# Patient Record
Sex: Male | Born: 1960 | Race: Black or African American | Hispanic: No | Marital: Single | Smoking: Never smoker
Health system: Southern US, Community
[De-identification: ages and names within clinical notes are randomized; demographics above are authoritative.]

---

## 1999-06-19 ENCOUNTER — Ambulatory Visit (HOSPITAL_COMMUNITY): Admission: RE | Admit: 1999-06-19 | Discharge: 1999-06-19 | Payer: Self-pay | Admitting: *Deleted

## 2009-11-04 ENCOUNTER — Ambulatory Visit: Admission: RE | Admit: 2009-11-04 | Discharge: 2009-11-04 | Payer: Self-pay | Admitting: Orthopaedic Surgery

## 2009-11-04 ENCOUNTER — Encounter (INDEPENDENT_AMBULATORY_CARE_PROVIDER_SITE_OTHER): Payer: Self-pay | Admitting: Orthopaedic Surgery

## 2009-11-04 ENCOUNTER — Ambulatory Visit: Payer: Self-pay | Admitting: Vascular Surgery

## 2010-12-22 NOTE — Procedures (Signed)
[place]  Patient:    Jeffrey Walton                         MRN: [id number] Proc. Date: 06/19/99 Adm. Date:  [id number] Attending:  Mingo Amber CC:         Redmond Baseman, M.D.                           Procedure Report  PROCEDURES: 1. Video upper endoscopy. 2. Video colonoscopy.  INDICATIONS:  A 50 year old male with microcytic anemia and iron deficiency. He has been on iron therapy and as of today, his hemoglobin is 12.5.  It should be  noted that he has donated over 2.5 gallons of blood in life time and this likely accounts for some of his iron deficiency.  PREPARATION:  He is n.p.o. since midnight, having taken Phospho-Soda prep and a  clear liquid diet yesterday.  The mucosa throughout is clean.  PREPROCEDURE SEDATION:  Prior to the upper endoscopy, he received 50 mg of Demerol and 6 mg Versed intravenously.  In addition, his throat was anesthetized with Hurricaine spray and he was on 2 L of nasal cannula O2.  PROCEDURE:  The Olympus video upper endoscope was inserted via the mouth and advanced through the upper esophageal sphincter with ease.  Intubation was then  carried out well into the descending duodenum.  On withdrawal, the mucosa was carefully evaluated.  Descending duodenum and bulb appeared normal.  There was moderate prepyloric gastritis with small erosions seen.  CLOtest was taken from  this area and it was immediately positive.  The remainder of the stomach, retroflexed view of the GE junction and the distal esophagus all appeared normal. At the conclusion of this procedure, the patients position was reversed for procedure #2: video colonoscopy.  He received an additional 40 mg of Demerol and 3 mg of Versed intravenously.   The Olympus video colonoscope was inserted via he rectum and advanced quite easily through the entire colon to the cecum.  Cecal landmarks were identified and photographed.  On  withdrawal, the mucosa was carefully evaluated and found to be entirely normal from cecum to retroflexed view of the rectum.  Patient tolerated the procedure well.  Pulse, blood pressure and oximetry testing were stable throughout.  He was observed in recovery for 45 minutes and discharged home alert with a benign abdomen.  IMPRESSION: 1. Helicobacter pylori gastritis, which could have accounted for ulcers or erosions, which are now largely healed.  This may have contributed to his iron deficiency. 2. Normal colonoscopy.  PLAN:  He is placed on Helidac therapy for 14 days.  He may return to see me if  needed.  Otherwise, he is referred back to the care of Dr. Modesto Charon.  He probably should remain on some iron supplement for 1 or 2 more months and defer donating  blood at least until 2001. DD:  06/19/99 TD:  06/20/99 Job: [id number]

## 2016-07-25 ENCOUNTER — Other Ambulatory Visit: Payer: Self-pay | Admitting: Occupational Medicine

## 2016-07-25 ENCOUNTER — Ambulatory Visit: Payer: Self-pay

## 2016-07-25 DIAGNOSIS — G8929 Other chronic pain: Secondary | ICD-10-CM

## 2016-07-25 DIAGNOSIS — M545 Low back pain: Principal | ICD-10-CM

## 2016-07-27 ENCOUNTER — Ambulatory Visit (INDEPENDENT_AMBULATORY_CARE_PROVIDER_SITE_OTHER): Payer: Worker's Compensation | Admitting: Orthopaedic Surgery

## 2016-07-27 VITALS — BP 151/93 | HR 84 | Temp 97.6°F

## 2016-07-27 DIAGNOSIS — M545 Low back pain, unspecified: Secondary | ICD-10-CM

## 2016-07-27 NOTE — Progress Notes (Signed)
Office Visit Note   Patient: Jeffrey Walton           Date of Birth: 12/23/1960           MRN: [id number] Visit Date: 07/27/2016              Requested by: No referring provider defined for this encounter. PCP: No primary care provider on file.   Assessment & Plan: Visit Diagnoses:  1. Acute bilateral low back pain without sciatica     Plan: We'll follow conservative treatment patient neurologically intact. He likely had a mild disc bulge with flare symptoms. He has prednisone pack which she will finish off. I reminded and make sure he takes it with food. We discussed GI symptoms second occurred her with the prednisone. His Ultram for pain. Work slip given no lifting more than 20 pounds 3 weeks and I'll recheck him again in 3 weeks.  Follow-Up Instructions: No Follow-up on file.   Orders:  No orders of the defined types were placed in this encounter.  No orders of the defined types were placed in this encounter.     Procedures: No procedures performed   Clinical Data: No additional findings.   Subjective: Chief Complaint  Patient presents with  . Lower Back - Injury    Patient is here today with lower back pain that is shooting down the left leg. 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   Injury  Pertinent negatives include no abdominal pain, chest pain, coughing, seizures or visual disturbance.  Patient has had some past problems with the his back which are resolved with conservative treatment. Many years ago had an MRI and he thinks he may have had of mild bulge in the disc at the time. Patient's states he thinks this was in 1996. Patient denies any associated bowel or bladder symptoms no fever or chills. When he first gets up he has had some difficulty get moving he has more symptoms that radiate in the left anterior thigh than the right. Patient's not fallen since injury. He was seen and placed on a prednisone pack also given some Flexeril and also  some Ultram for pain.  Review of Systems  Constitutional: Negative for chills and diaphoresis.  HENT: Negative for ear discharge, ear pain and nosebleeds.   Eyes: Negative for discharge and visual disturbance.       Patient uses glasses for reading  Respiratory: Negative for cough, choking and shortness of breath.   Cardiovascular: Negative for chest pain and palpitations.  Gastrointestinal: Negative for abdominal distention and abdominal pain.  Endocrine: Negative for cold intolerance and heat intolerance.  Genitourinary: Negative for flank pain and hematuria.  Musculoskeletal: Positive for back pain.  Skin: Negative for rash and wound.  Neurological: Negative for seizures and speech difficulty.  Hematological: Negative for adenopathy. Does not bruise/bleed easily.  Psychiatric/Behavioral: Negative for agitation and suicidal ideas.     Objective: Vital Signs: BP (!) 151/93 (BP Location: Right Arm, Patient Position: Sitting, Cuff Size: Large)   Pulse 84   Temp 97.6 F (36.4 C) (Oral)   Physical Exam  Constitutional: He is oriented to person, place, and time. He appears well-developed and well-nourished.  HENT:  Head: Normocephalic and atraumatic.  Eyes: EOM are normal. Pupils are equal, round, and reactive to light.  Neck: No tracheal deviation present. No thyromegaly present.  Cardiovascular: Normal rate.   Pulmonary/Chest: Effort normal. He has no wheezes.  Abdominal: Soft. Bowel sounds are  normal.  Musculoskeletal:  Pelvis is level patient's slow getting for sitting standing. No pain with internal/external rotation of his hips quad strength is good negative straight leg raising 90 negative popped a compression test anterior tib EHL is strong no gastroc soleus atrophy. Heel and toe walking is normal. Normal heel-to-toe gait.  Neurological: He is alert and oriented to person, place, and time.  Skin: Skin is warm and dry. Capillary refill takes less than 2 seconds.    Psychiatric: He has a normal mood and affect. His behavior is normal. Judgment and thought content normal.    Ortho Exam  Specialty Comments:  No specialty comments available.  Imaging: No results found. X-rays from urgent were reviewed with patient this shows minimal endplate spurring and some mild facet changes consistent with some lumbar spondylosis and some minimal disc degeneration by plain radiograph. There is no pars defects no spondylolisthesis no evidence of acute fracture.  PMFS History: There are no active problems to display for this patient.  No past medical history on file.  No family history on file.  No past surgical history on file. Social History   Occupational History  . Not on file.   Social History Main Topics  . Smoking status: Not on file  . Smokeless tobacco: Not on file  . Alcohol use Not on file  . Drug use: Unknown  . Sexual activity: Not on file

## 2016-08-10 DIAGNOSIS — M48061 Spinal stenosis, lumbar region without neurogenic claudication: Secondary | ICD-10-CM | POA: Diagnosis not present

## 2016-08-10 DIAGNOSIS — M47896 Other spondylosis, lumbar region: Secondary | ICD-10-CM | POA: Diagnosis not present

## 2016-08-10 DIAGNOSIS — M5126 Other intervertebral disc displacement, lumbar region: Secondary | ICD-10-CM | POA: Diagnosis not present

## 2016-08-19 DIAGNOSIS — K5900 Constipation, unspecified: Secondary | ICD-10-CM | POA: Diagnosis not present

## 2016-08-24 ENCOUNTER — Ambulatory Visit (INDEPENDENT_AMBULATORY_CARE_PROVIDER_SITE_OTHER): Payer: Worker's Compensation | Admitting: Orthopaedic Surgery

## 2016-08-24 VITALS — BP 129/81 | HR 117 | Ht 68.0 in | Wt 240.0 lb

## 2016-08-24 DIAGNOSIS — M5126 Other intervertebral disc displacement, lumbar region: Secondary | ICD-10-CM | POA: Diagnosis not present

## 2016-08-24 NOTE — Progress Notes (Signed)
Office Visit Note   Patient: Jeffrey Walton           Date of Birth: January 24, 1961           MRN: [id number] Visit Date: 08/24/2016              Requested by: No referring provider defined for this encounter. PCP: Darrow Bussing, MD   Assessment & Plan: Visit Diagnoses:  1. Herniated intervertebral disc of lumbar spine      Left inferior migrated HNP L3-4  Plan: Patient will return in one month if he develops recurrent radicular symptoms he will notify us. He'll continue a walking program. We discussed signs and symptoms of radiculopathy which she understands.  Follow-Up Instructions: No Follow-up on file.   Orders:  No orders of the defined types were placed in this encounter.  No orders of the defined types were placed in this encounter.     Procedures: No procedures performed   Clinical Data: No additional findings.   Subjective: Chief Complaint  Patient presents with  . Lower Back - Pain, Follow-up    Patient returns for three week follow up low back pain. He is doing much better. He states that the pain is not as bad and he is getting some of his mobility back. He brought a MRI that he had made at the [place] for review today. He has not taken the tramadol or flexeril in a week.   MRI scan is reviewed from the [place]. This shows disc herniation L3-4 with compression. Patient has gotten significantly better in the last 3 weeks since his scan and states his leg strength is improved is walking better and resting better and stopped taking his medication.  Review of Systems  Constitutional: Negative for chills and diaphoresis.  HENT: Negative for ear discharge, ear pain and nosebleeds.   Eyes: Negative for discharge and visual disturbance.  Respiratory: Negative for cough, choking and shortness of breath.   Cardiovascular: Negative for chest pain and palpitations.  Gastrointestinal: Negative for abdominal distention and abdominal pain.  Endocrine: Negative for cold intolerance  and heat intolerance.  Genitourinary: Negative for flank pain and hematuria.  Musculoskeletal:       L3-4 H&P by lumbar MRI at the [facility]. Patient's leg pain is improved he still has slight numbness states the back pain is gotten  Skin: Negative for rash and wound.  Neurological: Negative for seizures and speech difficulty.  Hematological: Negative for adenopathy. Does not bruise/bleed easily.  Psychiatric/Behavioral: Negative for agitation and suicidal ideas.     Objective: Vital Signs: BP 129/81   Pulse (!) 117   Ht 5\' 8"  (1.727 m)   Wt 240 lb (108.9 kg)   BMI 36.49 kg/m   Physical Exam  Constitutional: He is oriented to person, place, and time. He appears well-developed and well-nourished.  HENT:  Head: Normocephalic and atraumatic.  Eyes: EOM are normal. Pupils are equal, round, and reactive to light.  Neck: No tracheal deviation present. No thyromegaly present.  Cardiovascular: Normal rate.   Pulmonary/Chest: Effort normal. He has no wheezes.  Abdominal: Soft. Bowel sounds are normal.  Musculoskeletal:  Slight lumbar tenderness - notch tenderness. Negative straight leg raising 90 anterior tib quads ankle dorsiflexion plantar flexion posterior tibial resistance testing are all normal. Distal pulses are intact no pitting edema. Hip range of motion is normal. Negative Fabere. No synovitis of the upper extremities wrists elbows are normal good grip strength.  Neurological: He is alert and oriented  to person, place, and time.  Skin: Skin is warm and dry. Capillary refill takes less than 2 seconds.  Psychiatric: He has a normal mood and affect. His behavior is normal. Judgment and thought content normal.    Ortho Exam  Specialty Comments:  No specialty comments available.  Imaging: No results found.   PMFS History: Patient Active Problem List   Diagnosis Date Noted  . Herniated intervertebral disc of lumbar spine 08/24/2016   No past medical history on file.  No  family history on file.  No past surgical history on file. Social History   Occupational History  . Not on file.   Social History Main Topics  . Smoking status: Not on file  . Smokeless tobacco: Not on file  . Alcohol use Not on file  . Drug use: Unknown  . Sexual activity: Not on file

## 2016-09-26 ENCOUNTER — Ambulatory Visit (INDEPENDENT_AMBULATORY_CARE_PROVIDER_SITE_OTHER): Payer: Worker's Compensation | Admitting: Orthopaedic Surgery

## 2016-09-26 ENCOUNTER — Encounter (INDEPENDENT_AMBULATORY_CARE_PROVIDER_SITE_OTHER): Payer: Self-pay | Admitting: Orthopaedic Surgery

## 2016-09-26 ENCOUNTER — Encounter (INDEPENDENT_AMBULATORY_CARE_PROVIDER_SITE_OTHER): Payer: Self-pay

## 2016-09-26 VITALS — BP 121/77 | HR 86 | Ht 68.0 in | Wt 245.0 lb

## 2016-09-26 DIAGNOSIS — M5126 Other intervertebral disc displacement, lumbar region: Secondary | ICD-10-CM | POA: Diagnosis not present

## 2016-09-26 NOTE — Progress Notes (Signed)
Office Visit Note   Patient: Jeffrey Walton           Date of Birth: 11/05/1960           MRN: [id number] Visit Date: 09/26/2016              Requested by: Darrow Bussingibas Koirala, MD [address] [place], [place] [address] PCP: Darrow BussingKOIRALA,DIBAS, MD   Assessment & Plan: Visit Diagnoses:  1. HNP (herniated nucleus pulposus), lumbar     Plan: Since patient states that he has resumed his regular activities and we will have him follow-up the office with us as needed. Advised him that if he has any worsening of his left lower extremity radiculopathy and any signs of weakness that he should return immediately for evaluation. He voiced understanding.  Also discussed proper lifting techniques. Avoid twisting.  Follow-Up Instructions: Return if symptoms worsen or fail to improve.   Orders:  No orders of the defined types were placed in this encounter.  No orders of the defined types were placed in this encounter.     Procedures: No procedures performed   Clinical Data: No additional findings.   Subjective: Chief Complaint  Patient presents with  . Lower Back - Follow-up    Patient returns for one month follow up low back pain. He states that he is doing better. He feels that he is getting back to normal.   Patient states that low back and left leg pain numbness and tingling is greatly improved. Continues to have some intermittent symptoms into his left thigh when he sits for a while. Nothing radiating below his knee. States that he has returned back to doing his regular activities. Denies any feeling of leg weakness. No complaints of bowel or bladder incontinence. He is also working his regular job without restrictions although he has been trying to avoid lifting objects that are too heavy.  Reviewed patient's chart and he has an upcoming appointment with neurosurgeon Dr Luisa [facility]Kyle [facility].      Review of Systems  Constitutional: Negative.   HENT: Negative.     Respiratory: Negative.   Cardiovascular: Negative.   Genitourinary: Negative.   Musculoskeletal: Positive for back pain.  Psychiatric/Behavioral: Negative.      Objective: Vital Signs: BP 121/77   Pulse 86   Ht 5\' 8"  (1.727 m)   Wt 245 lb (111.1 kg)   BMI 37.25 kg/m   Physical Exam  Constitutional: He is oriented to person, place, and time. He appears well-developed. No distress.  HENT:  Head: Normocephalic and atraumatic.  Eyes: EOM are normal. Pupils are equal, round, and reactive to light.  Pulmonary/Chest: No respiratory distress.  Musculoskeletal:  Gait is normal. No lumbar paraspinal tenderness. Nontender over the bilateral hip greater scan of versus. Negative logroll. Negative straight leg raise. Neurovascularly intact. No focal motor deficits.  Neurological: He is alert and oriented to person, place, and time.  Skin: Skin is warm and dry.  Psychiatric: He has a normal mood and affect.    Ortho Exam  Specialty Comments:  No specialty comments available.  Imaging: No results found.   PMFS History: Patient Active Problem List   Diagnosis Date Noted  . Herniated intervertebral disc of lumbar spine 08/24/2016   No past medical history on file.  No family history on file.  No past surgical history on file. Social History   Occupational History  . Not on file.   Social History Main Topics  .  Smoking status: Never Smoker  . Smokeless tobacco: Never Used  . Alcohol use No  . Drug use: No  . Sexual activity: Not on file

## 2016-09-28 DIAGNOSIS — Q7649 Other congenital malformations of spine, not associated with scoliosis: Secondary | ICD-10-CM | POA: Diagnosis not present

## 2016-09-28 DIAGNOSIS — M5116 Intervertebral disc disorders with radiculopathy, lumbar region: Secondary | ICD-10-CM | POA: Diagnosis not present

## 2016-10-30 DIAGNOSIS — Z0000 Encounter for general adult medical examination without abnormal findings: Secondary | ICD-10-CM | POA: Diagnosis not present

## 2017-01-01 DIAGNOSIS — K644 Residual hemorrhoidal skin tags: Secondary | ICD-10-CM | POA: Diagnosis not present

## 2017-01-01 DIAGNOSIS — Z1211 Encounter for screening for malignant neoplasm of colon: Secondary | ICD-10-CM | POA: Diagnosis not present

## 2017-01-07 DIAGNOSIS — Z86010 Personal history of colonic polyps: Secondary | ICD-10-CM | POA: Diagnosis not present

## 2017-01-07 DIAGNOSIS — L290 Pruritus ani: Secondary | ICD-10-CM | POA: Diagnosis not present

## 2017-01-14 DIAGNOSIS — K644 Residual hemorrhoidal skin tags: Secondary | ICD-10-CM | POA: Diagnosis not present

## 2017-01-14 DIAGNOSIS — Z86010 Personal history of colonic polyps: Secondary | ICD-10-CM | POA: Diagnosis not present

## 2017-01-14 DIAGNOSIS — K648 Other hemorrhoids: Secondary | ICD-10-CM | POA: Diagnosis not present

## 2017-05-03 DIAGNOSIS — Z23 Encounter for immunization: Secondary | ICD-10-CM | POA: Diagnosis not present

## 2017-10-31 DIAGNOSIS — Z0000 Encounter for general adult medical examination without abnormal findings: Secondary | ICD-10-CM | POA: Diagnosis not present

## 2017-10-31 DIAGNOSIS — Z13220 Encounter for screening for lipoid disorders: Secondary | ICD-10-CM | POA: Diagnosis not present

## 2017-12-09 DIAGNOSIS — E538 Deficiency of other specified B group vitamins: Secondary | ICD-10-CM | POA: Diagnosis not present

## 2017-12-09 DIAGNOSIS — M545 Low back pain: Secondary | ICD-10-CM | POA: Diagnosis not present

## 2017-12-09 DIAGNOSIS — Z0000 Encounter for general adult medical examination without abnormal findings: Secondary | ICD-10-CM | POA: Diagnosis not present

## 2018-03-20 IMAGING — CR DG LUMBAR SPINE COMPLETE 4+V
5 series · 5 of 5 positions shown · non-contrast
Comparison: No priors.

CLINICAL DATA: 55-year-old male with injury to the low back on
07/17/2016 after lifting a 50 pound back. Persistent low back pain
since that time.

EXAM:
LUMBAR SPINE - COMPLETE 4+ VIEW

[view not recorded (1 of 5)]
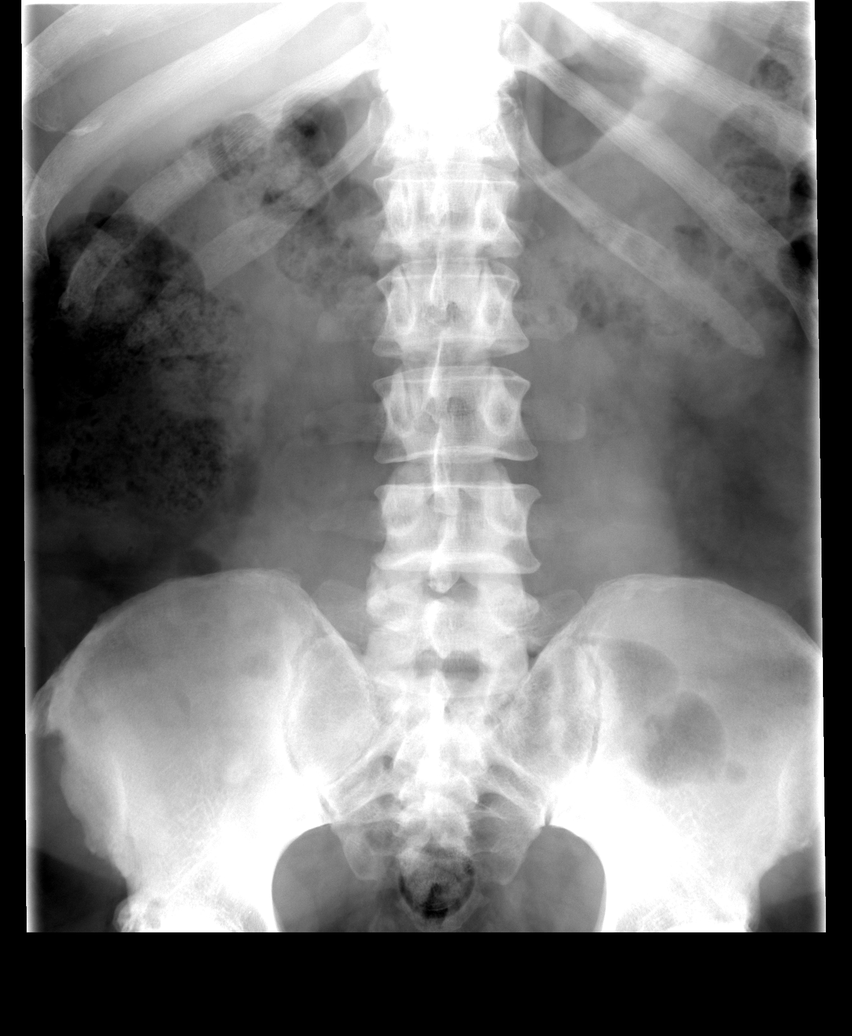

[view not recorded (2 of 5)]
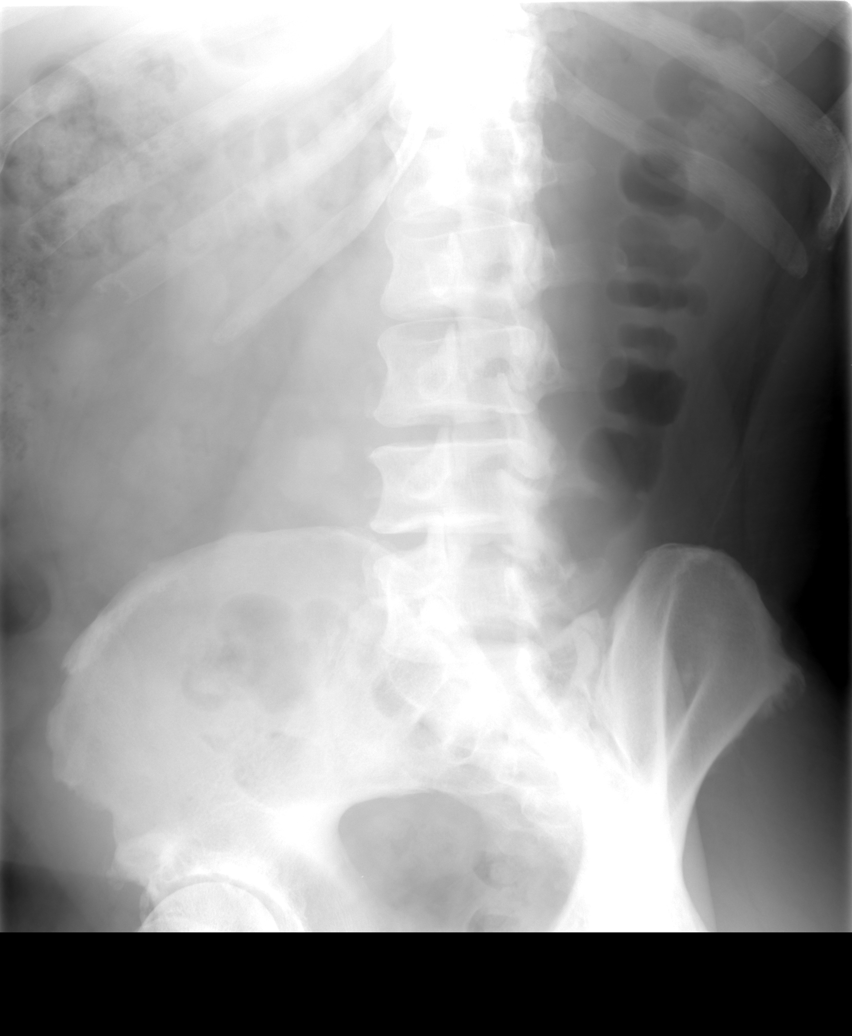

[view not recorded (3 of 5)]
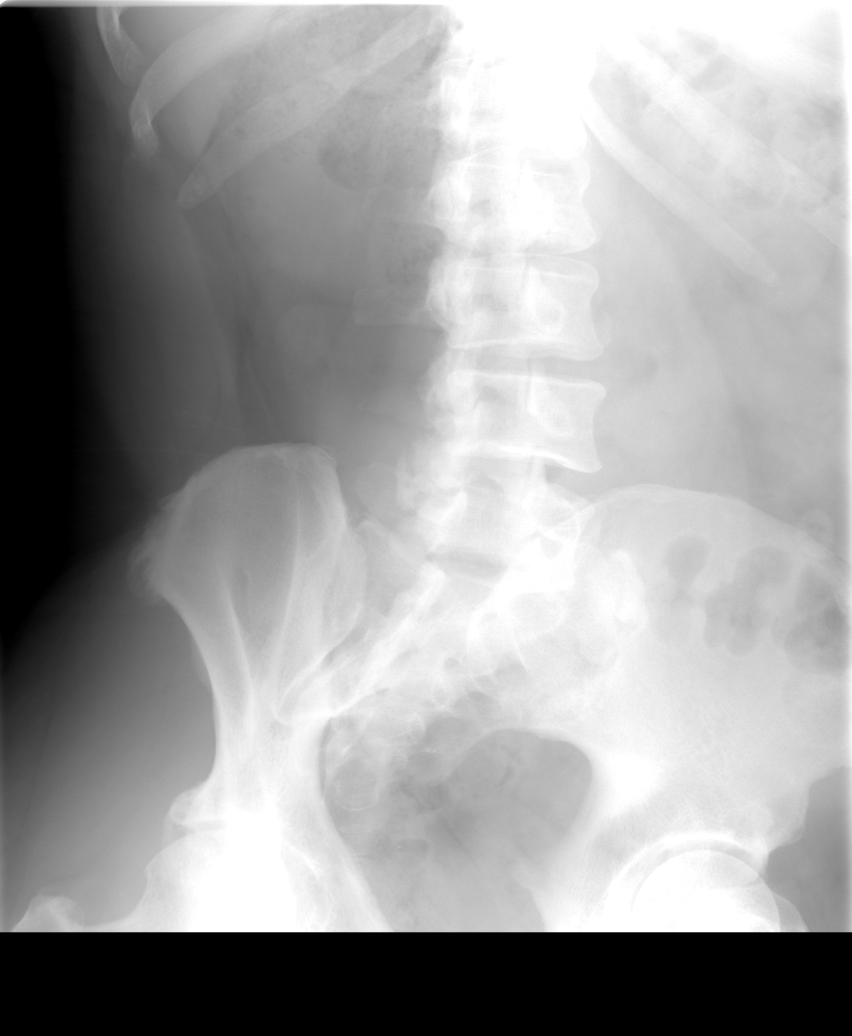

[view not recorded (4 of 5)]
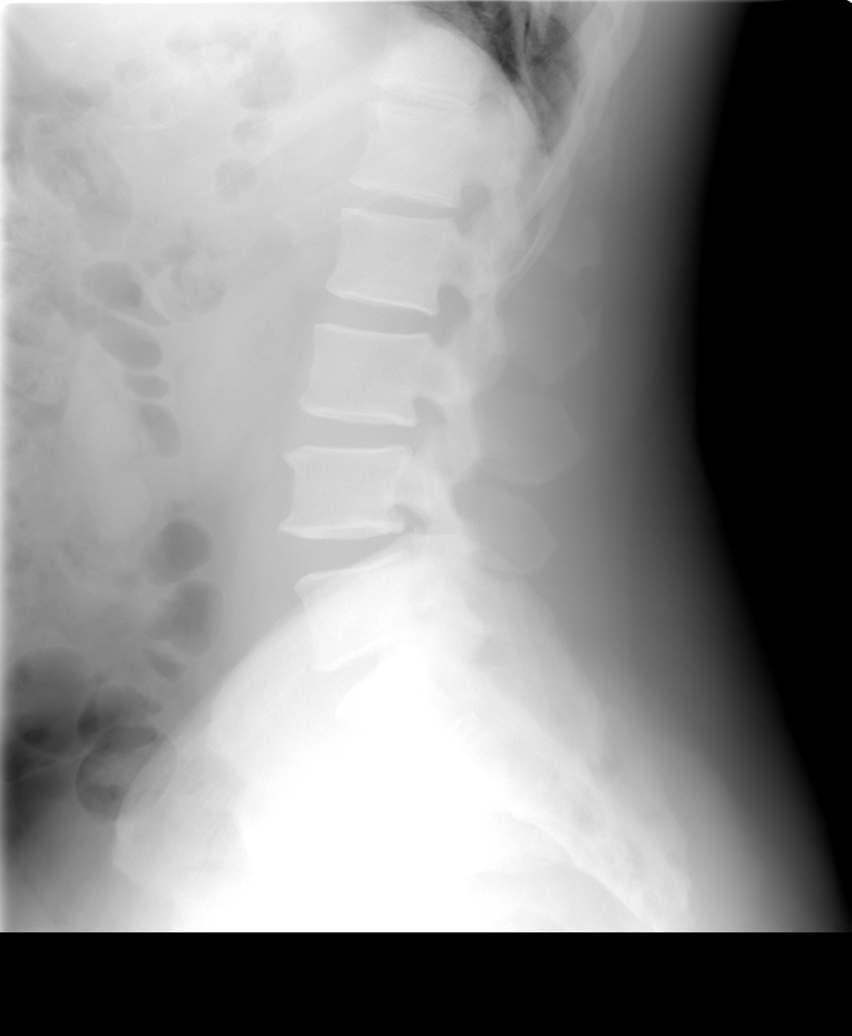

[view not recorded (5 of 5)]
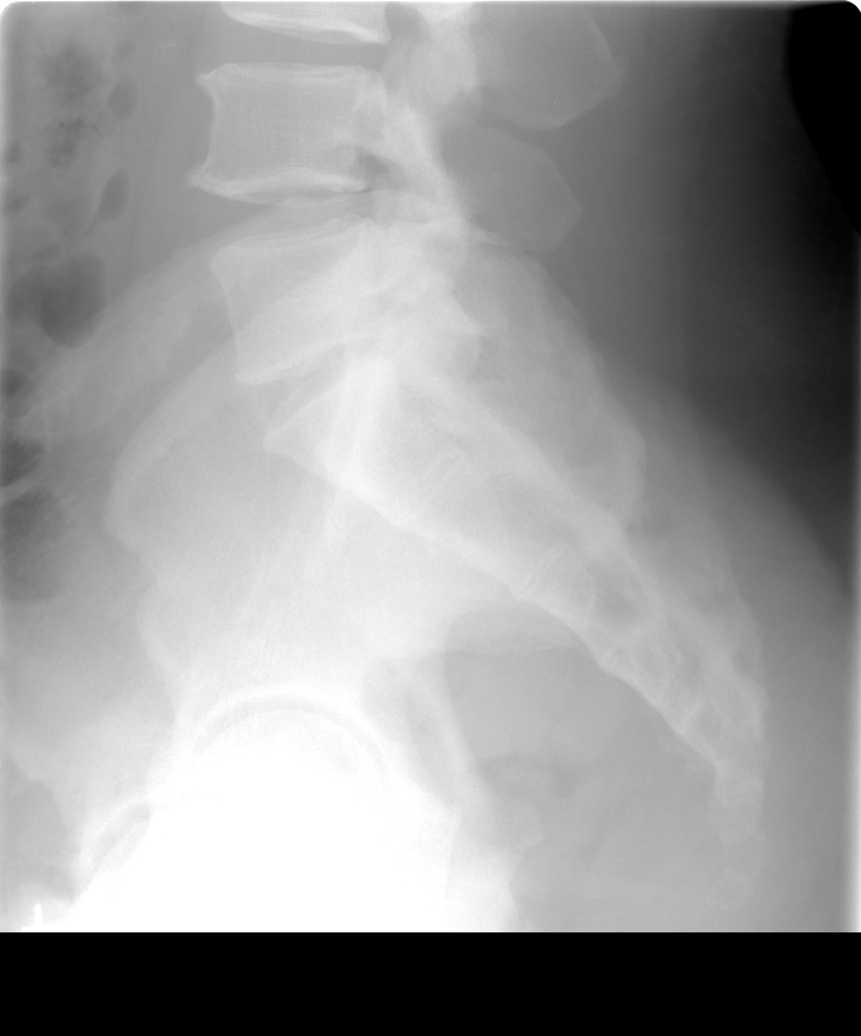

[5 of 5 positions shown; findings below may reference images not displayed]

FINDINGS: No acute displaced fracture or definite compression type fracture in
the lumbar spine. Mild multilevel degenerative disc disease, most
evident at T12-L1 and L5-S1. Mild multilevel facet arthropathy, most
severe L5-S1.
IMPRESSION: 1. No acute radiographic abnormality of the lumbar spine.
2. Mild multilevel degenerative disc disease and lumbar spondylosis,
as above.

## 2018-04-18 DIAGNOSIS — Z23 Encounter for immunization: Secondary | ICD-10-CM | POA: Diagnosis not present

## 2018-06-06 DIAGNOSIS — Z0000 Encounter for general adult medical examination without abnormal findings: Secondary | ICD-10-CM | POA: Diagnosis not present

## 2019-09-15 ENCOUNTER — Ambulatory Visit: Payer: 59 | Attending: Internal Medicine

## 2019-09-15 DIAGNOSIS — Z20822 Contact with and (suspected) exposure to covid-19: Secondary | ICD-10-CM

## 2019-09-16 ENCOUNTER — Other Ambulatory Visit: Payer: Self-pay

## 2019-09-16 LAB — NOVEL CORONAVIRUS, NAA: SARS-CoV-2, NAA: NOT DETECTED
